# Patient Record
Sex: Female | Born: 1975 | Race: White | Hispanic: No | Marital: Married | Smoking: Former smoker
Health system: Southern US, Community
[De-identification: ages and names within clinical notes are randomized; demographics above are authoritative.]

## PROBLEM LIST (undated history)

## (undated) DIAGNOSIS — J45909 Unspecified asthma, uncomplicated: Secondary | ICD-10-CM

## (undated) DIAGNOSIS — F419 Anxiety disorder, unspecified: Secondary | ICD-10-CM

## (undated) HISTORY — PX: TUBAL LIGATION: SHX77

## (undated) HISTORY — DX: Unspecified asthma, uncomplicated: J45.909

## (undated) HISTORY — DX: Anxiety disorder, unspecified: F41.9

---

## 1999-12-05 ENCOUNTER — Other Ambulatory Visit: Admission: RE | Admit: 1999-12-05 | Discharge: 1999-12-05 | Payer: Self-pay | Admitting: Obstetrics and Gynecology

## 2001-11-30 ENCOUNTER — Emergency Department (HOSPITAL_COMMUNITY): Admission: EM | Admit: 2001-11-30 | Discharge: 2001-12-01 | Payer: Self-pay | Admitting: Emergency Medicine

## 2003-07-04 ENCOUNTER — Other Ambulatory Visit: Admission: RE | Admit: 2003-07-04 | Discharge: 2003-07-04 | Payer: Self-pay | Admitting: Obstetrics & Gynecology

## 2005-09-29 ENCOUNTER — Emergency Department (HOSPITAL_COMMUNITY): Admission: EM | Admit: 2005-09-29 | Discharge: 2005-09-29 | Payer: Self-pay | Admitting: Emergency Medicine

## 2006-05-21 ENCOUNTER — Ambulatory Visit (HOSPITAL_COMMUNITY): Admission: RE | Admit: 2006-05-21 | Discharge: 2006-05-21 | Payer: Self-pay | Admitting: Chiropractic Medicine

## 2007-10-25 ENCOUNTER — Ambulatory Visit (HOSPITAL_COMMUNITY): Admission: RE | Admit: 2007-10-25 | Discharge: 2007-10-25 | Payer: Self-pay | Admitting: Chiropractic Medicine

## 2007-11-19 ENCOUNTER — Encounter: Admission: RE | Admit: 2007-11-19 | Discharge: 2007-11-19 | Payer: Self-pay | Admitting: Orthopaedic Surgery

## 2007-11-25 ENCOUNTER — Ambulatory Visit: Payer: Self-pay | Admitting: Pulmonary Disease

## 2007-11-25 LAB — CONVERTED CEMR LAB: Mono Screen: NEGATIVE

## 2007-11-26 ENCOUNTER — Telehealth (INDEPENDENT_AMBULATORY_CARE_PROVIDER_SITE_OTHER): Payer: Self-pay | Admitting: *Deleted

## 2007-11-29 ENCOUNTER — Ambulatory Visit: Payer: Self-pay | Admitting: Internal Medicine

## 2007-12-01 ENCOUNTER — Encounter: Payer: Self-pay | Admitting: Pulmonary Disease

## 2009-02-20 ENCOUNTER — Ambulatory Visit (HOSPITAL_COMMUNITY): Admission: RE | Admit: 2009-02-20 | Discharge: 2009-02-20 | Payer: Self-pay | Admitting: Chiropractic Medicine

## 2010-07-08 ENCOUNTER — Ambulatory Visit (HOSPITAL_COMMUNITY): Admission: RE | Admit: 2010-07-08 | Discharge: 2010-07-08 | Payer: Self-pay | Admitting: Obstetrics and Gynecology

## 2011-02-11 DIAGNOSIS — F411 Generalized anxiety disorder: Secondary | ICD-10-CM | POA: Insufficient documentation

## 2011-03-08 LAB — CBC
HCT: 39.8 % (ref 36.0–46.0)
Hemoglobin: 13.6 g/dL (ref 12.0–15.0)
MCH: 29.9 pg (ref 26.0–34.0)
MCHC: 34.1 g/dL (ref 30.0–36.0)
MCV: 87.5 fL (ref 78.0–100.0)
Platelets: 274 10*3/uL (ref 150–400)
RBC: 4.54 MIL/uL (ref 3.87–5.11)
RDW: 12.6 % (ref 11.5–15.5)
WBC: 7.7 10*3/uL (ref 4.0–10.5)

## 2011-03-08 LAB — HCG, SERUM, QUALITATIVE: Preg, Serum: NEGATIVE

## 2011-05-06 DIAGNOSIS — F339 Major depressive disorder, recurrent, unspecified: Secondary | ICD-10-CM | POA: Insufficient documentation

## 2011-05-06 DIAGNOSIS — E559 Vitamin D deficiency, unspecified: Secondary | ICD-10-CM | POA: Insufficient documentation

## 2011-05-06 NOTE — Assessment & Plan Note (Signed)
[facility]                             PULMONARY OFFICE NOTE   NAME:MAPLE, Wendy Baxter                        MRN:          [id number]  DATE:11/25/2007                            DOB:          07/16/1976    HISTORY OF PRESENT ILLNESS:  The patient is a 34 year old white female  who I have been asked to see for possible pneumonia.  The patient states  that she was in her usual state of health until November 7, when she  began to develop chest discomfort, achiness all over, as well as  malaise.  She had no cough and no purulence and no upper respiratory  symptoms at that time.  She was seen at [facility] and  apparently felt to have pneumonia by chest x-ray.  She was given IM  Rocephin as well as p.o. antibiotics as well as decongestants.  The  patient felt somewhat better but still continued to have malaise and a  lot of fatigue.  Two weeks later she began to develop a sore throat with  ear pain but again no cough or mucus production.  She thought that she  might be a little bit more short of breath.  She returned to the urgent  care and was given a breathing treatment and started on Nasacort and  ProAir.  A chest x-ray was done again that was felt to have worsening  pneumonia and was referred her for further evaluation.  The patient  states that currently she really has very few to no pulmonary symptoms.  She feels achy and without energy.  She still has no cough or mucus  production.   PAST MEDICAL HISTORY:  Significant for asthma as a child but no problems  since, otherwise is noncontributory.   CURRENT MEDICATIONS:  Mucinex over-the-counter b.i.d., Nasacort AQ  daily, ProAir two puffs t.i.d. to q.i.d., BCP daily.   The patient has no known drug allergies.   SOCIAL HISTORY:  The patient is single and has no children.  She has a  history of smoking one pack per day for 11-12 years.  She has not smoked  in 3 years.  She works for [employer].   FAMILY HISTORY:  Unremarkable in first-degree relatives.   REVIEW OF SYSTEMS:  As per history of present illness.  Also see patient  intake form documented in the chart.   PHYSICAL EXAM:  GENERAL:  She is an obese white female in no acute  distress.  Blood pressure is 120/88, pulse 107, temperature 98.4, weight is 195  pounds.  She is 5 feet 3 inches tall.  O2 saturation on room air is 97%.  HEENT:  Pupils equal, round and reactive to light and accommodation.  Extraocular movements are intact.  Nares are patent without discharge.  Oropharynx is clear.  TMs are normal and there is no abnormality of her  auditory canals.  NECK:  Supple without JVD or lymphadenopathy.  CHEST:  Totally clear to auscultation.  CARDIAC:  Regular rate and rhythm.  No murmurs, rubs or gallops.  ABDOMEN:  Soft,  nontender, with good bowel sounds.  GENITAL, RECTAL, BREASTS:  Exams not done and not indicated.  LOWER EXTREMITIES:  Without edema.  Pulses are intact distally.  NEUROLOGIC:  Alert and oriented with no obvious motor deficits.   IMPRESSION:  General malaise as well as fatigue of unknown etiology.  It  certainly sounds as if she had more of an upper respiratory  infection/acute viral syndrome than anything else.  I have reviewed her  x-rays personally and there is absolutely no evidence for a pneumonia or  other acute pulmonary process on both films.  This is verified by the  radiologist.  I would wonder if she may have infectious mononucleosis or  possibly an occult sinusitis that is just slowly resolving.   PLAN:  1. Discontinue all current medications.  2. Check Mono Spot.  3. If the Mono Spot is negative, we will check a limited CT scan of      the sinuses.   I will call the patient after the above have returned.     Barbaraann Share, MD,FCCP  Electronically Signed    [facility]  DD: 11/25/2007  DT: 11/25/2007  Job #: [id number]   cc:   [facility]

## 2013-10-05 ENCOUNTER — Ambulatory Visit (INDEPENDENT_AMBULATORY_CARE_PROVIDER_SITE_OTHER): Payer: BLUE CROSS/BLUE SHIELD | Admitting: Family Medicine

## 2013-10-05 ENCOUNTER — Encounter: Payer: Self-pay | Admitting: Family Medicine

## 2013-10-05 VITALS — BP 110/80 | HR 68 | Temp 97.6°F | Resp 18 | Ht 62.0 in | Wt 214.0 lb

## 2013-10-05 DIAGNOSIS — Z0000 Encounter for general adult medical examination without abnormal findings: Secondary | ICD-10-CM

## 2013-10-05 DIAGNOSIS — T148 Other injury of unspecified body region: Secondary | ICD-10-CM

## 2013-10-05 DIAGNOSIS — R5383 Other fatigue: Secondary | ICD-10-CM

## 2013-10-05 DIAGNOSIS — F3289 Other specified depressive episodes: Secondary | ICD-10-CM

## 2013-10-05 DIAGNOSIS — F411 Generalized anxiety disorder: Secondary | ICD-10-CM

## 2013-10-05 DIAGNOSIS — F329 Major depressive disorder, single episode, unspecified: Secondary | ICD-10-CM

## 2013-10-05 DIAGNOSIS — W57XXXA Bitten or stung by nonvenomous insect and other nonvenomous arthropods, initial encounter: Secondary | ICD-10-CM | POA: Insufficient documentation

## 2013-10-05 DIAGNOSIS — F32A Depression, unspecified: Secondary | ICD-10-CM

## 2013-10-05 DIAGNOSIS — R5381 Other malaise: Secondary | ICD-10-CM

## 2013-10-05 LAB — COMPREHENSIVE METABOLIC PANEL
ALT: 12 U/L (ref 0–35)
AST: 12 U/L (ref 0–37)
Albumin: 4.5 g/dL (ref 3.5–5.2)
Alkaline Phosphatase: 100 U/L (ref 39–117)
BUN: 12 mg/dL (ref 6–23)
CO2: 27 mEq/L (ref 19–32)
Calcium: 9.7 mg/dL (ref 8.4–10.5)
Chloride: 105 mEq/L (ref 96–112)
Creat: 0.69 mg/dL (ref 0.50–1.10)
Glucose, Bld: 88 mg/dL (ref 70–99)
Potassium: 5 mEq/L (ref 3.5–5.3)
Sodium: 141 mEq/L (ref 135–145)
Total Bilirubin: 0.5 mg/dL (ref 0.3–1.2)
Total Protein: 7 g/dL (ref 6.0–8.3)

## 2013-10-05 LAB — CBC WITH DIFFERENTIAL/PLATELET
Basophils Absolute: 0 10*3/uL (ref 0.0–0.1)
Basophils Relative: 0 % (ref 0–1)
Eosinophils Absolute: 0.4 10*3/uL (ref 0.0–0.7)
Eosinophils Relative: 4 % (ref 0–5)
HCT: 43 % (ref 36.0–46.0)
Hemoglobin: 14.5 g/dL (ref 12.0–15.0)
Lymphocytes Relative: 28 % (ref 12–46)
Lymphs Abs: 2.7 10*3/uL (ref 0.7–4.0)
MCH: 28.1 pg (ref 26.0–34.0)
MCHC: 33.7 g/dL (ref 30.0–36.0)
MCV: 83.3 fL (ref 78.0–100.0)
Monocytes Absolute: 0.5 10*3/uL (ref 0.1–1.0)
Monocytes Relative: 5 % (ref 3–12)
Neutro Abs: 5.9 10*3/uL (ref 1.7–7.7)
Neutrophils Relative %: 63 % (ref 43–77)
Platelets: 339 10*3/uL (ref 150–400)
RBC: 5.16 MIL/uL — ABNORMAL HIGH (ref 3.87–5.11)
RDW: 13.2 % (ref 11.5–15.5)
WBC: 9.5 10*3/uL (ref 4.0–10.5)

## 2013-10-05 LAB — LIPID PANEL
Cholesterol: 172 mg/dL (ref 0–200)
HDL: 48 mg/dL (ref >39)
LDL Cholesterol: 100 mg/dL — ABNORMAL HIGH (ref 0–99)
Total CHOL/HDL Ratio: 3.6 Ratio
Triglycerides: 121 mg/dL (ref <150)
VLDL: 24 mg/dL (ref 0–40)

## 2013-10-05 LAB — TSH: TSH: 1.319 u[IU]/mL (ref 0.350–4.500)
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
## 2013-10-05 NOTE — Patient Instructions (Addendum)
I recommend eye visit once a year I recommend dental visit every 6 months Goal is to  Exercise 30 minutes 5 days a week We will call with lab results Start a multivitamin  Release of records Dr. Billy Coast- GYN Release of records- [facility] Use topical cortisone 1%around ring  F/U 6 weeks

## 2013-10-05 NOTE — Assessment & Plan Note (Signed)
Treated in July 2014 with doxy, she does not have classic symptoms of chronic lymes, but will check titer

## 2013-10-05 NOTE — Assessment & Plan Note (Signed)
She has known anxiety disorder states she does not feel like her typical anxiety when she would get very red. She did not want start medications today

## 2013-10-05 NOTE — Assessment & Plan Note (Signed)
I think she has an underlying depression as she has lost interest in many things including sexual activity with her husband. I think she would benefit from medication or therapy however she declines both at this time. We'll reconvene in 6 weeks she was to make sure that her blood work is normal before she considers anything else.

## 2013-10-06 LAB — B. BURGDORFI ANTIBODIES: B burgdorferi Ab IgG+IgM: 0.9 {ISR}

## 2013-11-16 ENCOUNTER — Ambulatory Visit: Payer: BLUE CROSS/BLUE SHIELD | Admitting: Family Medicine

## 2015-02-22 ENCOUNTER — Ambulatory Visit (INDEPENDENT_AMBULATORY_CARE_PROVIDER_SITE_OTHER): Payer: BLUE CROSS/BLUE SHIELD

## 2015-02-22 ENCOUNTER — Encounter: Payer: Self-pay | Admitting: Podiatry

## 2015-02-22 ENCOUNTER — Ambulatory Visit (INDEPENDENT_AMBULATORY_CARE_PROVIDER_SITE_OTHER): Payer: BLUE CROSS/BLUE SHIELD | Admitting: Podiatry

## 2015-02-22 VITALS — Ht 63.0 in | Wt 250.0 lb

## 2015-02-22 DIAGNOSIS — M79672 Pain in left foot: Secondary | ICD-10-CM | POA: Diagnosis not present

## 2015-02-22 DIAGNOSIS — M79671 Pain in right foot: Secondary | ICD-10-CM | POA: Diagnosis not present

## 2015-02-22 DIAGNOSIS — M722 Plantar fascial fibromatosis: Secondary | ICD-10-CM | POA: Diagnosis not present

## 2015-02-22 MED ORDER — MELOXICAM 7.5 MG PO TABS: 7.5000 mg | ORAL_TABLET | Freq: Every day | ORAL | Status: DC

## 2015-02-22 MED ORDER — TRIAMCINOLONE ACETONIDE 10 MG/ML IJ SUSP
10.0000 mg | Freq: Once | INTRAMUSCULAR | Status: AC
  Administered 2015-02-22: 10 mg

## 2015-02-22 NOTE — Patient Instructions (Signed)

## 2015-02-22 NOTE — Progress Notes (Signed)
   Subjective:    Patient ID: Wendy Baxter, female    DOB: Oct 19, 1976, 39 y.o.   MRN: [id number]  HPI Comments: 39 year old female presents the office today with complaints of bilateral heel pain with left greater than right. She states this has been ongoing for a couple months and his been progressive. She states that she has a throbbing pain in the bottom of her heel. He states that she has pain after periods of rest or in the morning which is relieved by ambulation. She also states that she has some pain after standing for prolonged periods time. She denies any history of injury or trauma to the area and she denies any change or increase activity of the time of onset of symptoms. She states the pain does not wake her up at night. Denies any swelling or redness overlying the area. No other complaints at this time.  Foot Pain      Review of Systems  All other systems reviewed and are negative.      Objective:   Physical Exam AAO x3, NAD DP/PT pulses palpable bilaterally, CRT less than 3 seconds Protective sensation intact with Simms Weinstein monofilament, vibratory sensation intact, Achilles tendon reflex intact; negative tinel's sign Tenderness to palpation overlying the plantar medial tubercle of the calcaneus to bilateral heesl at the insertion of the plantar fascia with the left greater than right. There is no pain along the course of plantar fascial within the arch of the foot and the plantar fascia appears intact. There is no pain with lateral compression of the calcaneus or pain the vibratory sensation. No pain on the posterior aspect of the calcaneus or along the course/insertion of the Achilles tendon. There is no overlying edema, erythema, increase in warmth. No other areas of tenderness palpation or pain with vibratory sensation to the foot/ankle bilaterally. MMT 5/5, ROM WNL No open lesions or pre-ulcerative lesions are identified. No pain with calf compression, swelling,  warmth, erythema.    Assessment & Plan:  39 year old female with bilateral heel pain left greater than right likely plantar fasciitis -X-rays were obtained and reviewed with the patient. -Treatment options both conservative and surgical were discussed including alternatives, risks, complications. -Patient elects to proceed with steroid injection into the left heel. She wishes to hold off on the right as it is currently not as symptomatic. Under sterile skin preparation, a total of 2.5cc of kenalog 10, 0.5% Marcaine plain, and 2% lidocaine plain were infiltrated into the symptomatic area without complication. A band-aid was applied. Patient tolerated the injection well without complication. Post-injection care with discussed with the patient. Discussed with the patient to ice the area over the next couple of days to help prevent a steroid flare.  -Plantar fascial taping was applied. Also dispensed a plantar fascial brace for once the tape is removed. -Prescribed meloxicam. Discussed side effects the medication and directed to stop and call the office should any occur. -Discussed stretching exercises. -Ice to the area. -Discussed shoe gear modifications and possible orthotics. -Follow-up in 3 weeks or sooner should any problems arise. In the meantime, encouraged to call the office with any questions, concerns, change in symptoms.

## 2015-03-15 ENCOUNTER — Ambulatory Visit: Payer: BLUE CROSS/BLUE SHIELD | Admitting: Podiatry

## 2015-03-27 ENCOUNTER — Ambulatory Visit (INDEPENDENT_AMBULATORY_CARE_PROVIDER_SITE_OTHER): Payer: BLUE CROSS/BLUE SHIELD | Admitting: Podiatry

## 2015-03-27 ENCOUNTER — Encounter: Payer: Self-pay | Admitting: Podiatry

## 2015-03-27 VITALS — BP 116/82 | HR 89 | Resp 16 | Ht 63.0 in | Wt 230.0 lb

## 2015-03-27 DIAGNOSIS — M722 Plantar fascial fibromatosis: Secondary | ICD-10-CM | POA: Diagnosis not present

## 2015-03-27 MED ORDER — TRIAMCINOLONE ACETONIDE 10 MG/ML IJ SUSP
10.0000 mg | Freq: Once | INTRAMUSCULAR | Status: AC
  Administered 2015-03-27: 10 mg

## 2015-03-28 NOTE — Progress Notes (Signed)
Patient ID: Wendy MiloJamie L Baxter, female   DOB: [birth year], 39 y.o.   MRN: [id number]  Subjective: 39 year old female returns the office today follow-up evaluation of bilateral heel pain, plantar fasciitis of the left greater than the right. She doesn't since last appointment she has had significant improvement in symptoms will she does continue to have some discomfort the left heel. She's been taking the anti-inflammatories as needed and she is also continued with stretching icing activities as much as possible. Denies any recent injury or trauma. Denies any swelling or redness. No other complaints at this time.  Objective: AAO x3, NAD DP/PT pulses palpable bilaterally, CRT less than 3 seconds Protective sensation intact with Simms Weinstein monofilament, vibratory sensation intact, Achilles tendon reflex intact There is tenderness to palpation overlying the plantar medial tubercle of the calcaneus to bilateral heels at the insertion of the plantar fascia with the left >right. There is no pain along the course of plantar fascial within the arch of the foot. The plantar fascia appears intact bilaterally. There is no pain with lateral compression of the calcaneus or pain the vibratory sensation. No pain on the posterior aspect of the calcaneus or along the course/insertion of the Achilles tendon. There is no overlying edema, erythema, increase in warmth. No other areas of tenderness palpation or pain with vibratory sensation to the foot/ankle bilaterally. MMT 5/5, ROM WNL No open lesions or pre-ulcerative lesions are identified. No pain with calf compression, swelling, warmth, erythema.  Assessment: 39 year old female with resolving heel pain bilaterally  Plan: -Treatment options were discussed including alternatives, risks, complications. -Patient elects to proceed with steroid injection into the left heel as she does continue to have symptoms to this area mostly. Under sterile skin preparation, a total  of 2.5cc of kenalog 10, 0.5% Marcaine plain, and 2% lidocaine plain were infiltrated into the symptomatic area without complication. A band-aid was applied. Patient tolerated the injection well without complication. Post-injection care with discussed with the patient. Discussed with the patient to ice the area over the next couple of days to help prevent a steroid flare.  -Continue plantar fascial brace -Ice to the area -Continue stretching activities -Meloxicam as needed -Discussed shoe gear modifications and not to go barefoot even at home. -Follow-up in 3 weeks or sooner if any problems are to arise. In the meantime call the office with any questions, concerns, change in symptoms.

## 2015-04-17 ENCOUNTER — Ambulatory Visit: Payer: BLUE CROSS/BLUE SHIELD | Admitting: Podiatry

## 2016-10-02 DIAGNOSIS — Y999 Unspecified external cause status: Secondary | ICD-10-CM | POA: Diagnosis not present

## 2016-10-02 DIAGNOSIS — R079 Chest pain, unspecified: Secondary | ICD-10-CM | POA: Insufficient documentation

## 2016-10-02 DIAGNOSIS — Y939 Activity, unspecified: Secondary | ICD-10-CM | POA: Insufficient documentation

## 2016-10-02 DIAGNOSIS — Y92410 Unspecified street and highway as the place of occurrence of the external cause: Secondary | ICD-10-CM | POA: Insufficient documentation

## 2016-10-02 DIAGNOSIS — S2220XA Unspecified fracture of sternum, initial encounter for closed fracture: Secondary | ICD-10-CM | POA: Diagnosis not present

## 2016-10-02 DIAGNOSIS — J45909 Unspecified asthma, uncomplicated: Secondary | ICD-10-CM | POA: Insufficient documentation

## 2016-10-02 DIAGNOSIS — Z87891 Personal history of nicotine dependence: Secondary | ICD-10-CM | POA: Diagnosis not present

## 2016-10-02 DIAGNOSIS — S299XXA Unspecified injury of thorax, initial encounter: Secondary | ICD-10-CM | POA: Diagnosis present
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
## 2016-10-03 ENCOUNTER — Emergency Department (HOSPITAL_COMMUNITY): Payer: No Typology Code available for payment source

## 2016-10-03 ENCOUNTER — Emergency Department (HOSPITAL_COMMUNITY)
Admission: EM | Admit: 2016-10-03 | Discharge: 2016-10-03 | Disposition: A | Discharge status: Home / Self Care | Payer: No Typology Code available for payment source | Attending: Emergency Medicine | Admitting: Emergency Medicine

## 2016-10-03 ENCOUNTER — Encounter (HOSPITAL_COMMUNITY): Payer: Self-pay | Admitting: *Deleted

## 2016-10-03 DIAGNOSIS — S2220XA Unspecified fracture of sternum, initial encounter for closed fracture: Secondary | ICD-10-CM | POA: Diagnosis not present

## 2016-10-03 DIAGNOSIS — S2222XA Fracture of body of sternum, initial encounter for closed fracture: Secondary | ICD-10-CM

## 2016-10-03 LAB — I-STAT CHEM 8, ED
BUN: 12 mg/dL (ref 6–20)
Calcium, Ion: 1.11 mmol/L — ABNORMAL LOW (ref 1.15–1.40)
Chloride: 106 mmol/L (ref 101–111)
Creatinine, Ser: 0.7 mg/dL (ref 0.44–1.00)
Glucose, Bld: 103 mg/dL — ABNORMAL HIGH (ref 65–99)
HCT: 42 % (ref 36.0–46.0)
HEMOGLOBIN: 14.3 g/dL (ref 12.0–15.0)
Potassium: 3.7 mmol/L (ref 3.5–5.1)
Sodium: 142 mmol/L (ref 135–145)
TCO2: 24 mmol/L (ref 0–100)

## 2016-10-03 LAB — I-STAT BETA HCG BLOOD, ED (MC, WL, AP ONLY): I-stat hCG, quantitative: <5 m[IU]/mL (ref <5)

## 2016-10-03 MED ORDER — MORPHINE SULFATE (PF) 4 MG/ML IV SOLN
4.0000 mg | Freq: Once | INTRAVENOUS | Status: AC
  Administered 2016-10-03: 4 mg via INTRAVENOUS
  Filled 2016-10-03: qty 1

## 2016-10-03 MED ORDER — IOPAMIDOL (ISOVUE-300) INJECTION 61%
INTRAVENOUS | Status: AC
  Administered 2016-10-03: 75 mL
  Filled 2016-10-03: qty 75

## 2016-10-03 MED ORDER — IBUPROFEN 800 MG PO TABS: 800.0000 mg | ORAL_TABLET | Freq: Three times a day (TID) | ORAL | 0 refills | Status: AC | PRN

## 2016-10-03 MED ORDER — OXYCODONE-ACETAMINOPHEN 5-325 MG PO TABS: 1.0000 | ORAL_TABLET | Freq: Four times a day (QID) | ORAL | 0 refills | Status: AC | PRN

## 2016-10-03 MED ORDER — ONDANSETRON HCL 4 MG/2ML IJ SOLN
4.0000 mg | Freq: Once | INTRAMUSCULAR | Status: AC
  Administered 2016-10-03: 4 mg via INTRAVENOUS
  Filled 2016-10-03: qty 2

## 2016-10-03 MED ORDER — DOCUSATE SODIUM 100 MG PO CAPS: 100.0000 mg | ORAL_CAPSULE | Freq: Two times a day (BID) | ORAL | 0 refills | Status: AC
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
## 2016-10-03 NOTE — Discharge Instructions (Signed)
Please use your incentive spirometer every hour while awake for the next 2 weeks.   Please stop with your primary care physician for further pain management.

## 2016-10-03 NOTE — ED Triage Notes (Signed)
See EMS note

## 2016-10-03 NOTE — ED Notes (Signed)
Pt taken to CT.
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
